# Patient Record
Sex: Male | Born: 1990 | Race: White | Hispanic: No | Marital: Single | Smoking: Never smoker
Health system: Southern US, Community
[De-identification: ages and names within clinical notes are randomized; demographics above are authoritative.]

## PROBLEM LIST (undated history)

## (undated) DIAGNOSIS — F909 Attention-deficit hyperactivity disorder, unspecified type: Secondary | ICD-10-CM

## (undated) DIAGNOSIS — F419 Anxiety disorder, unspecified: Secondary | ICD-10-CM

## (undated) DIAGNOSIS — F32A Depression, unspecified: Secondary | ICD-10-CM

## (undated) DIAGNOSIS — F329 Major depressive disorder, single episode, unspecified: Secondary | ICD-10-CM

---
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
## 2012-05-21 ENCOUNTER — Encounter: Payer: Self-pay | Admitting: Internal Medicine

## 2012-05-21 ENCOUNTER — Other Ambulatory Visit (INDEPENDENT_AMBULATORY_CARE_PROVIDER_SITE_OTHER): Payer: BC Managed Care – PPO

## 2012-05-21 ENCOUNTER — Telehealth: Payer: Self-pay | Admitting: *Deleted

## 2012-05-21 ENCOUNTER — Ambulatory Visit (INDEPENDENT_AMBULATORY_CARE_PROVIDER_SITE_OTHER): Payer: BC Managed Care – PPO | Admitting: Internal Medicine

## 2012-05-21 VITALS — BP 122/88 | HR 76 | Temp 97.4°F | Ht 72.0 in | Wt 179.5 lb

## 2012-05-21 DIAGNOSIS — Z130 Encounter for screening for diseases of the blood and blood-forming organs and certain disorders involving the immune mechanism: Secondary | ICD-10-CM

## 2012-05-21 DIAGNOSIS — Z0000 Encounter for general adult medical examination without abnormal findings: Secondary | ICD-10-CM

## 2012-05-21 DIAGNOSIS — Z131 Encounter for screening for diabetes mellitus: Secondary | ICD-10-CM

## 2012-05-21 DIAGNOSIS — Z13220 Encounter for screening for lipoid disorders: Secondary | ICD-10-CM

## 2012-05-21 LAB — BASIC METABOLIC PANEL
Calcium: 9.3 mg/dL (ref 8.4–10.5)
GFR: 99.72 mL/min (ref >60.00)
Glucose, Bld: 104 mg/dL — ABNORMAL HIGH (ref 70–99)

## 2012-05-21 LAB — LIPID PANEL
Cholesterol: 166 mg/dL (ref 0–200)
HDL: 33.6 mg/dL — ABNORMAL LOW (ref >39.00)
VLDL: 33.2 mg/dL (ref 0.0–40.0)

## 2012-05-21 LAB — CBC
Hemoglobin: 14.8 g/dL (ref 13.0–17.0)
Platelets: 237 10*3/uL (ref 150.0–400.0)
WBC: 10.2 10*3/uL (ref 4.5–10.5)

## 2012-05-21 NOTE — Patient Instructions (Signed)
Health Maintenance, 18- to 21-Year-Old SCHOOL PERFORMANCE After high school completion, the young adult may be attending college, technical or vocational school, or entering the military or the work force. SOCIAL AND EMOTIONAL DEVELOPMENT The young adult establishes adult relationships and explores sexual identity. Young adults may be living at home or in a college dorm or apartment. Increasing independence is important with young adults. Throughout adolescence, teens should assume responsibility of their own health care. IMMUNIZATIONS Most young adults should be fully vaccinated. A booster dose of Tdap (tetanus, diphtheria, and pertussis, or "whooping cough"), a dose of meningococcal vaccine to protect against a certain type of bacterial meningitis, hepatitis A, human papillomarvirus (HPV), chickenpox, or measles vaccines may be indicated, if not given at an earlier age. Annual influenza or "flu" vaccination should be considered during flu season.   TESTING Annual screening for vision and hearing problems is recommended. Vision should be screened objectively at least once between 18 and 21 years of age. The young adult may be screened for anemia or tuberculosis. Young adults should have a blood test to check for high cholesterol during this time period. Young adults should be screened for use of alcohol and drugs. If the young adult is sexually active, screening for sexually transmitted infections, pregnancy, or HIV may be performed. Screening for cervical cancer should be performed within 3 years of beginning sexual activity. NUTRITION AND ORAL HEALTH  Adequate calcium intake is important. Consume 3 servings of low-fat milk and dairy products daily. For those who do not drink milk or consume dairy products, calcium enriched foods, such as juice, bread, or cereal, dark, leafy greens, or canned fish are alternate sources of calcium.   Drink plenty of water. Limit fruit juice to 8 to 12 ounces per day.  Avoid sugary beverages or sodas.   Discourage skipping meals, especially breakfast. Teens should eat a good variety of vegetables and fruits, as well as lean meats.   Avoid high fat, high salt, and high sugar foods, such as candy, chips, and cookies.   Encourage young adults to participate in meal planning and preparation.   Eat meals together as a family whenever possible. Encourage conversation at mealtime.   Limit fast food choices and eating out at restaurants.   Brush teeth twice a day and floss.   Schedule dental exams twice a year.  SLEEP Regular sleep habits are important. PHYSICAL, SOCIAL, AND EMOTIONAL DEVELOPMENT  One hour of regular physical activity daily is recommended. Continue to participate in sports.   Encourage young adults to develop their own interests and consider community service or volunteerism.   Provide guidance to the young adult in making decisions about college and work plans.   Make sure that young adults know that they should never be in a situation that makes them uncomfortable, and they should tell partners if they do not want to engage in sexual activity.   Talk to the young adult about body image. Eating disorders may be noted at this time. Young adults may also be concerned about being overweight. Monitor the young adult for weight gain or loss.   Mood disturbances, depression, anxiety, alcoholism, or attention problems may be noted in young adults. Talk to the caregiver if there are concerns about mental illness.   Negotiate limit setting and independent decision making.   Encourage the young adult to handle conflict without physical violence.   Avoid loud noises which may impair hearing.   Limit television and computer time to 2 hours per   day. Individuals who engage in excessive sedentary activity are more likely to become overweight.  RISK BEHAVIORS  Sexually active young adults need to take precautions against pregnancy and sexually  transmitted infections. Talk to young adults about contraception.   Provide a tobacco-free and drug-free environment for the young adult. Talk to the young adult about drug, tobacco, and alcohol use among friends or at friends' homes. Make sure the young adult knows that smoking tobacco or marijuana and taking drugs have health consequences and may impact brain development.   Teach the young adult about appropriate use of over-the-counter or prescription medicines.   Establish guidelines for driving and for riding with friends.   Talk to young adults about the risks of drinking and driving or boating. Encourage the young adult to call you if he or she or friends have been drinking or using drugs.   Remind young adults to wear seat belts at all times in cars and life vests in boats.   Young adults should always wear a properly fitted helmet when they are riding a bicycle.   Use caution with all-terrain vehicles (ATVs) or other motorized vehicles.   Do not keep handguns in the home. (If you do, the gun and ammunition should be locked separately and out of the young adult's access.)   Equip your home with smoke detectors and change the batteries regularly. Make sure all family members know the fire escape plans for your home.   Teach young adults not to swim alone and not to dive in shallow water.   All individuals should wear sunscreen that protects against UVA and UVB light with at least a sun protection factor (SPF) of 30 when out in the sun. This minimizes sun burning.  WHAT'S NEXT? Young adults should visit their pediatrician or family physician yearly. By young adulthood, health care should be transitioned to a family physician or internal medicine specialist. Sexually active females may want to begin annual physical exams with a gynecologist. Document Released: 08/11/2006 Document Revised: 08/08/2011 Document Reviewed: 08/31/2006 [facility] Patient Information 2013 [place], [place].    Self-Exam Of The Testicles Young men ages 68-35 are the ones who most commonly get cancer of the testicles. About 300 young men die of this disease each year. Testicular cancer does occur in middle-aged or older men but to a lesser extent. This cancer can almost always be cured if it is found before it gets bad and if it is treated. WORDS TO KNOW:  Testicles are the 2 egg-shaped glands that make hormones and sperm in men.   The scrotum is the skin around testicles.   Epididymis is the rope-like part that is behind and above each testis. It collects sperm made by the testis.  WHICH MEN ARE AT HIGH RISK FOR CANCER OF THE TESTICLES?  Men between ages 8 to 61.   Men who are Caucasian.   Men who were born with a testicle that had not moved down into the scrotum.   Men whose testicles have gotten smaller because of an infection.   Men whose fathers or brothers have had cancer of the testicles.  SYMPTOMS OF TESTICULAR CANCER  A painless swelling in one of your testicles.   A hard lump. Some lumps may be an infection.   A heavy feeling in your testicles.   An ache in your lower belly (abdomen) or groin.  HOW OFTEN SHOULD I CHECK MY TESTICLES? You should check your testicles every month. HOW SHOULD I DO THIS  CHECK? It is best to check your testicles right after a warm shower or bath.   Look at your testicles for any swelling. You may need to use a mirror.   Use both your hands to roll each testicle between your thumb and fingers. Feel for any lumps or changes in the size of the testicle. Press firmly. You may find that one testicle is a little bigger than the other. This is normal.   Next, check the epididymis on each testicle. This is the part where most testicular cancers happen. It is normal for the epididymis to feel soft and uneven. When you get used to how your epididymis feels, you will be able to tell if there is any change.   WHAT IF I FIND ANY SWELLINGS OR LUMPS?   Call your  doctor. Some lumps may be an infection. If the lump or swelling is a cancer, it can be treated before it gets worse.   Document Released: 08/12/2008 Document Revised: 08/08/2011 Document Reviewed: 08/12/2008 [facility] Patient Information 2013 [place], [place].

## 2012-05-21 NOTE — Telephone Encounter (Signed)
PATIENT MOTHER CALLED NEEDING TO KNOW WHAT THE NURSE PRACTIONER NEEDED CONCERNING HER SON ADHD. EXPLAINED THAT WOULD NEED DIAGNOSIS OF PROVIDER OR PEDIATRICIAN THAT DIAGNOSED PATIENT WITH ADHD.  MOTHER STATES WILL TRY TO FIND OUT THIS INFORMATION. FAX # GIVEN TO MOM OF OUR OFFICE, HER CB # IS [phone].

## 2012-05-21 NOTE — Progress Notes (Signed)
HPI  Pt presents to the clinic today to establish care. He does have some questions regarding ADHD. He was diagnosed at age 21 and placed on Concerta. He was on that for 6 years before he decided he wanted to stop taking the medicine. He did well throughout middle school and high school. Now that he is in college he is having some recurrent symptoms. He is having trouble concentrating, having the motivation to get homework done. He is wondering wether or not he should go back on the concerta.  History reviewed. No pertinent past medical history.  No current outpatient prescriptions on file.    No Known Allergies  Family History  Problem Relation Age of Onset  . Hypertension Maternal Grandmother   . Cancer Maternal Grandfather     Prostate Cancer  . Hypertension Maternal Grandfather   . Diabetes Paternal Grandmother     History   Social History  . Marital Status: Single    Spouse Name: N/A    Number of Children: N/A  . Years of Education: 12+   Occupational History  . Student    Social History Main Topics  . Smoking status: Never Smoker   . Smokeless tobacco: Never Used  . Alcohol Use: 4.2 oz/week    7 Cans of beer per week  . Drug Use: No  . Sexually Active: Yes    Birth Control/ Protection: Condom   Other Topics Concern  . Not on file   Social History Narrative   Regular exercise-noCaffeine Use-yes    ROS:  Constitutional: Denies fever, malaise, fatigue, headache or abrupt weight changes.  HEENT: Denies eye pain, eye redness, ear pain, ringing in the ears, wax buildup, runny nose, nasal congestion, bloody nose, or sore throat. Respiratory: Denies difficulty breathing, shortness of breath, cough or sputum production.   Cardiovascular: Denies chest pain, chest tightness, palpitations or swelling in the hands or feet.  Gastrointestinal: Denies abdominal pain, bloating, constipation, diarrhea or blood in the stool.  GU: Denies frequency, urgency, pain with urination,  blood in urine, odor or discharge. Musculoskeletal: Denies decrease in range of motion, difficulty with gait, muscle pain or joint pain and swelling.  Skin: Acne on face. Denies redness, rashes, lesions or ulcercations.  Neurological: Denies dizziness, difficulty with memory, difficulty with speech or problems with balance and coordination.   No other specific complaints in a complete review of systems (except as listed in HPI above).  PE:  BP 122/88  Pulse 76  Temp 97.4 F (36.3 C) (Oral)  Ht 6' (1.829 m)  Wt 179 lb 8 oz (81.421 kg)  BMI 24.34 kg/m2  SpO2 97% Wt Readings from Last 3 Encounters:  05/21/12 179 lb 8 oz (81.421 kg)    General: Appears his stated age, well developed, well nourished in NAD. HEENT: Head: normal shape and size; Eyes: sclera white, no icterus, conjunctiva pink, PERRLA and EOMs intact; Ears: Tm's gray and intact, normal light reflex; Nose: mucosa pink and moist, septum midline; Throat/Mouth: Teeth present, mucosa pink and moist, no lesions or ulcerations noted.  Neck: Normal range of motion. Neck supple, trachea midline. No massses, lumps or thyromegaly present.  Cardiovascular: Normal rate and rhythm. S1,S2 noted.  No murmur, rubs or gallops noted. No JVD or BLE edema. No carotid bruits noted. Pulmonary/Chest: Normal effort and positive vesicular breath sounds. No respiratory distress. No wheezes, rales or ronchi noted.  Abdomen: Soft and nontender. Normal bowel sounds, no bruits noted. No distention or masses noted. Liver, spleen and  kidneys non palpable. Musculoskeletal: Normal range of motion. No signs of joint swelling. No difficulty with gait.  Neurological: Alert and oriented. Cranial nerves II-XII intact. Coordination normal. +DTRs bilaterally. Psychiatric: Mood and affect normal. Behavior is normal. Judgment and thought content normal. Denies SI/HI    Assessment and Plan:  Preventative Health Maintenance:  Continue diet and exercise Pt declines  flu shot Tdap up to date Will obtain basic screening labs today  ADHD:  Fax records from previous provider If has a diagnosis of ADHD, will restart concerta  Depression:  Reassured pt May benefit from CBT Pt declines treatment at this time  RTC in 1 year or sooner if needed.

## 2012-06-05 ENCOUNTER — Other Ambulatory Visit: Payer: Self-pay | Admitting: Internal Medicine

## 2012-06-05 ENCOUNTER — Telehealth: Payer: Self-pay | Admitting: *Deleted

## 2012-06-05 DIAGNOSIS — F900 Attention-deficit hyperactivity disorder, predominantly inattentive type: Secondary | ICD-10-CM

## 2012-06-05 MED ORDER — METHYLPHENIDATE HCL ER (OSM) 36 MG PO TBCR: 36.0000 mg | EXTENDED_RELEASE_TABLET | ORAL | Status: DC

## 2012-06-05 NOTE — Telephone Encounter (Signed)
Ash,  RX done. He needs to see me in 1 month to assess medication effectiveness. Rene Kocher

## 2012-06-05 NOTE — Telephone Encounter (Signed)
Pt informed rx ready for pickup and of NP's advisement for F/U OV in 1 month. Rx placed upfront in cabinet ready for pickup.

## 2012-06-05 NOTE — Telephone Encounter (Signed)
Pt would like to restart the Concerta.

## 2012-06-05 NOTE — Telephone Encounter (Signed)
Message copied by Carin Primrose on Tue Jun 05, 2012 11:31 AM ------      Message from: Lorre Munroe      Created: Tue Jun 05, 2012  8:12 AM      Regarding: Medical Records       Ash,      Can you please call Mr. Hentges and let him know that we received his medical records confirming that he did have ADHD and was treated with Concerta. Does he want to move forward with being retested by psychiatry or would he like to restart the Concerta?      Rene Kocher

## 2012-06-08 ENCOUNTER — Telehealth: Payer: Self-pay | Admitting: Internal Medicine

## 2012-06-08 NOTE — Telephone Encounter (Signed)
Patients mother is calling because the need a prior auth for the Concerta that he picked up yesterday, if you have questions call the patients mother

## 2012-06-08 NOTE — Telephone Encounter (Signed)
PA form for Concerta received-placed on NP's desk for completion/review.

## 2012-06-11 NOTE — Telephone Encounter (Signed)
Patients mother is calling back to check the status of the PA for the patients concerta, would like a call back today @ [phone]

## 2012-06-11 NOTE — Telephone Encounter (Signed)
[place], Ashley is out of the office today. Hopefully she will be back tomorrow and can work on it then. Rene Kocher

## 2012-06-12 NOTE — Telephone Encounter (Signed)
PA form faxed to insurance company, awaiting repsonse. Pt's mother informed.

## 2012-06-13 NOTE — Telephone Encounter (Signed)
PA for Concerta approved 06/12/2012-06/12/2013. [facility] informed.

## 2012-06-13 NOTE — Telephone Encounter (Signed)
Pt's mother also informed PA for Concerta improved.

## 2012-07-18 ENCOUNTER — Ambulatory Visit (INDEPENDENT_AMBULATORY_CARE_PROVIDER_SITE_OTHER): Payer: BC Managed Care – PPO | Admitting: Internal Medicine

## 2012-07-18 ENCOUNTER — Encounter: Payer: Self-pay | Admitting: Internal Medicine

## 2012-07-18 VITALS — BP 128/80 | HR 81 | Temp 97.1°F | Ht 72.0 in | Wt 181.0 lb

## 2012-07-18 DIAGNOSIS — F988 Other specified behavioral and emotional disorders with onset usually occurring in childhood and adolescence: Secondary | ICD-10-CM

## 2012-07-18 DIAGNOSIS — F900 Attention-deficit hyperactivity disorder, predominantly inattentive type: Secondary | ICD-10-CM | POA: Insufficient documentation

## 2012-07-18 MED ORDER — METHYLPHENIDATE HCL ER (OSM) 36 MG PO TBCR: 36.0000 mg | EXTENDED_RELEASE_TABLET | ORAL | Status: DC

## 2012-07-18 NOTE — Progress Notes (Signed)
  Subjective:    Patient ID: Malik Cook, male    DOB: 01-27-1991, 22 y.o.   MRN: [id number]  HPI  Pt presents to the clinic today to f/u ADHD. At his last visit, he was restarted on his ADHD medication. At that time, he was interested in seeing a psychiatrist to be evaluated to still see if he had ADHD. He also had his records sent from his pediatrician who managed his ADHD when he was a child. He reports increased focus and attention. He has not had any decrease in his appetite. He feels like he is tolerating the medication well.  Review of Systems  No past medical history on file.  Current Outpatient Prescriptions  Medication Sig Dispense Refill  . methylphenidate (CONCERTA) 36 MG CR tablet Take 1 tablet (36 mg total) by mouth every morning.  30 tablet  0   No current facility-administered medications for this visit.    No Known Allergies  Family History  Problem Relation Age of Onset  . Hypertension Maternal Grandmother   . Cancer Maternal Grandfather     Prostate Cancer  . Hypertension Maternal Grandfather   . Diabetes Paternal Grandmother     History   Social History  . Marital Status: Single    Spouse Name: N/A    Number of Children: N/A  . Years of Education: 12+   Occupational History  . Student    Social History Main Topics  . Smoking status: Never Smoker   . Smokeless tobacco: Never Used  . Alcohol Use: 4.2 oz/week    7 Cans of beer per week  . Drug Use: No  . Sexually Active: Yes    Birth Control/ Protection: Condom   Other Topics Concern  . Not on file   Social History Narrative   Regular exercise-no   Caffeine Use-yes     Constitutional: Denies fever, malaise, fatigue, headache or abrupt weight changes.  Respiratory: Denies difficulty breathing, shortness of breath, cough or sputum production.   Cardiovascular: Denies chest pain, chest tightness, palpitations or swelling in the hands or feet.  Neurological: Denies dizziness, difficulty with  memory, difficulty with speech or problems with balance and coordination.   No other specific complaints in a complete review of systems (except as listed in HPI above).     Objective:   Physical Exam    BP 128/80  Pulse 81  Temp(Src) 97.1 F (36.2 C) (Oral)  Ht 6' (1.829 m)  Wt 181 lb (82.101 kg)  BMI 24.54 kg/m2  SpO2 97% Wt Readings from Last 3 Encounters:  07/18/12 181 lb (82.101 kg)  05/21/12 179 lb 8 oz (81.421 kg)    General: Appears his stated age, well developed, well nourished in NAD. Cardiovascular: Normal rate and rhythm. S1,S2 noted.  No murmur, rubs or gallops noted. No JVD or BLE edema. No carotid bruits noted. Pulmonary/Chest: Normal effort and positive vesicular breath sounds. No respiratory distress. No wheezes, rales or ronchi noted.  Neurological: Alert and oriented. Cranial nerves II-XII intact. Coordination normal. +DTRs bilaterally.      Assessment & Plan:   ADHD,  No additional workup required:  eRx for Concerta 36 mg daily  RTC in 1 month for f/u

## 2012-07-18 NOTE — Patient Instructions (Signed)
Attention Deficit Hyperactivity Disorder Attention deficit hyperactivity disorder (ADHD) is a problem with behavior issues based on the way the brain functions (neurobehavioral disorder). It is a common reason for behavior and academic problems in school. CAUSES  The cause of ADHD is unknown in most cases. It may run in families. It sometimes can be associated with learning disabilities and other behavioral problems. SYMPTOMS  There are 3 types of ADHD. The 3 types and some of the symptoms include:  Inattentive  Gets bored or distracted easily.  Loses or forgets things. Forgets to hand in homework.  Has trouble organizing or completing tasks.  Difficulty staying on task.  An inability to organize daily tasks and school work.  Leaving projects, chores, or homework unfinished.  Trouble paying attention or responding to details. Careless mistakes.  Difficulty following directions. Often seems like is not listening.  Dislikes activities that require sustained attention (like chores or homework).  Hyperactive-impulsive  Feels like it is impossible to sit still or stay in a seat. Fidgeting with hands and feet.  Trouble waiting turn.  Talking too much or out of turn. Interruptive.  Speaks or acts impulsively.  Aggressive, disruptive behavior.  Constantly busy or on the go, noisy.  Combined  Has symptoms of both of the above. Often children with ADHD feel discouraged about themselves and with school. They often perform well below their abilities in school. These symptoms can cause problems in home, school, and in relationships with peers. As children get older, the excess motor activities can calm down, but the problems with paying attention and staying organized persist. Most children do not outgrow ADHD but with good treatment can learn to cope with the symptoms. DIAGNOSIS  When ADHD is suspected, the diagnosis should be made by professionals trained in ADHD.  Diagnosis will  include:  Ruling out other reasons for the child's behavior.  The caregivers will check with the child's school and check their medical records.  They will talk to teachers and parents.  Behavior rating scales for the child will be filled out by those dealing with the child on a daily basis. A diagnosis is made only after all information has been considered. TREATMENT  Treatment usually includes behavioral treatment often along with medicines. It may include stimulant medicines. The stimulant medicines decrease impulsivity and hyperactivity and increase attention. Other medicines used include antidepressants and certain blood pressure medicines. Most experts agree that treatment for ADHD should address all aspects of the child's functioning. Treatment should not be limited to the use of medicines alone. Treatment should include structured classroom management. The parents must receive education to address rewarding good behavior, discipline, and limit-setting. Tutoring or behavioral therapy or both should be available for the child. If untreated, the disorder can have long-term serious effects into adolescence and adulthood. HOME CARE INSTRUCTIONS   Often with ADHD there is a lot of frustration among the family in dealing with the illness. There is often blame and anger that is not warranted. This is a life long illness. There is no way to prevent ADHD. In many cases, because the problem affects the family as a whole, the entire family may need help. A therapist can help the family find better ways to handle the disruptive behaviors and promote change. If the child is young, most of the therapist's work is with the parents. Parents will learn techniques for coping with and improving their child's behavior. Sometimes only the child with the ADHD needs counseling. Your caregivers can help   you make these decisions.  Children with ADHD may need help in organizing. Some helpful tips include:  Keep  routines the same every day from wake-up time to bedtime. Schedule everything. This includes homework and playtime. This should include outdoor and indoor recreation. Keep the schedule on the refrigerator or a bulletin board where it is frequently seen. Mark schedule changes as far in advance as possible.  Have a place for everything and keep everything in its place. This includes clothing, backpacks, and school supplies.  Encourage writing down assignments and bringing home needed books.  Offer your child a well-balanced diet. Breakfast is especially important for school performance. Children should avoid drinks with caffeine including:  Soft drinks.  Coffee.  Tea.  However, some older children (adolescents) may find these drinks helpful in improving their attention.  Children with ADHD need consistent rules that they can understand and follow. If rules are followed, give small rewards. Children with ADHD often receive, and expect, criticism. Look for good behavior and praise it. Set realistic goals. Give clear instructions. Look for activities that can foster success and self-esteem. Make time for pleasant activities with your child. Give lots of affection.  Parents are their children's greatest advocates. Learn as much as possible about ADHD. This helps you become a stronger and better advocate for your child. It also helps you educate your child's teachers and instructors if they feel inadequate in these areas. Parent support groups are often helpful. A national group with local chapters is called CHADD (Children and Adults with Attention Deficit Hyperactivity Disorder). PROGNOSIS  There is no cure for ADHD. Children with the disorder seldom outgrow it. Many find adaptive ways to accommodate the ADHD as they mature. SEEK MEDICAL CARE IF:  Your child has repeated muscle twitches, cough or speech outbursts.  Your child has sleep problems.  Your child has a marked loss of  appetite.  Your child develops depression.  Your child has new or worsening behavioral problems.  Your child develops dizziness.  Your child has a racing heart.  Your child has stomach pains.  Your child develops headaches. Document Released: 05/06/2002 Document Revised: 08/08/2011 Document Reviewed: 12/17/2007 [employer] Patient Information 2013 [employer].  

## 2012-08-19 ENCOUNTER — Encounter (HOSPITAL_COMMUNITY): Payer: Self-pay | Admitting: *Deleted

## 2012-08-19 ENCOUNTER — Emergency Department (HOSPITAL_COMMUNITY)
Admission: EM | Admit: 2012-08-19 | Discharge: 2012-08-19 | Disposition: A | Discharge status: Home / Self Care | Payer: Worker's Compensation | Attending: Emergency Medicine | Admitting: Emergency Medicine

## 2012-08-19 DIAGNOSIS — S0003XA Contusion of scalp, initial encounter: Secondary | ICD-10-CM | POA: Insufficient documentation

## 2012-08-19 DIAGNOSIS — Y9389 Activity, other specified: Secondary | ICD-10-CM | POA: Insufficient documentation

## 2012-08-19 DIAGNOSIS — Y990 Civilian activity done for income or pay: Secondary | ICD-10-CM | POA: Insufficient documentation

## 2012-08-19 DIAGNOSIS — S0093XA Contusion of unspecified part of head, initial encounter: Secondary | ICD-10-CM

## 2012-08-19 DIAGNOSIS — Z79899 Other long term (current) drug therapy: Secondary | ICD-10-CM | POA: Insufficient documentation

## 2012-08-19 DIAGNOSIS — Y9289 Other specified places as the place of occurrence of the external cause: Secondary | ICD-10-CM | POA: Insufficient documentation

## 2012-08-19 DIAGNOSIS — S1093XA Contusion of unspecified part of neck, initial encounter: Secondary | ICD-10-CM | POA: Insufficient documentation

## 2012-08-19 DIAGNOSIS — W2209XA Striking against other stationary object, initial encounter: Secondary | ICD-10-CM | POA: Insufficient documentation

## 2012-08-19 DIAGNOSIS — R5381 Other malaise: Secondary | ICD-10-CM | POA: Insufficient documentation
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
## 2012-08-19 NOTE — ED Provider Notes (Signed)
History    This chart was scribed for non-physician practitioner working with Richardean Canal, MD by Donne Anon, ED Scribe. This patient was seen in room WTR8/WTR8 and the patient's care was started at 2222.   CSN: [id number]  Arrival date & time 08/19/12  2204   First MD Initiated Contact with Patient 08/19/12 2222      Chief Complaint  Patient presents with  . Head Injury     The history is provided by the patient. No language interpreter was used.   Malik Cook is a 22 y.o. male who presents to the Emergency Department complaining of sudden onset, constant, non changing, non radiating head pain due to being hit in the head with a steel oven door at work 7 hours PTA. He states he does not remember being hit but denies LOC. He states he has been having an intermittent shooting HA. He states it feels hard to communicate and feels like he has to think more. He reports associated tiredness, dizziness when standing for long periods of time and difficulty walking. He denies vision changes, vomitting or any other pain.  History reviewed. No pertinent past medical history.  Past Surgical History  Procedure Laterality Date  . Mole removal  1997    Family History  Problem Relation Age of Onset  . Hypertension Maternal Grandmother   . Cancer Maternal Grandfather     Prostate Cancer  . Hypertension Maternal Grandfather   . Diabetes Paternal Grandmother     History  Substance Use Topics  . Smoking status: Never Smoker   . Smokeless tobacco: Never Used  . Alcohol Use: 4.2 oz/week    7 Cans of beer per week      Review of Systems  Neurological: Positive for headaches.  All other systems reviewed and are negative.    Allergies  Review of patient's allergies indicates no known allergies.  Home Medications   Current Outpatient Rx  Name  Route  Sig  Dispense  Refill  . methylphenidate (CONCERTA) 36 MG CR tablet   Oral   Take 1 tablet (36 mg total) by mouth every morning.    30 tablet   0     BP 143/91  Pulse 69  Temp(Src) 98 F (36.7 C) (Oral)  Resp 18  SpO2 100%  Physical Exam  Nursing note and vitals reviewed. Constitutional: He is oriented to person, place, and time. He appears well-developed and well-nourished. No distress.  HENT:  Head: Normocephalic and atraumatic.  Eyes: EOM are normal.  Neck: Neck supple. No tracheal deviation present.  Cardiovascular: Normal rate.   Pulmonary/Chest: Effort normal. No respiratory distress.  Musculoskeletal: Normal range of motion.  Neurological: He is alert and oriented to person, place, and time. He has normal strength. No cranial nerve deficit or sensory deficit. He displays a negative Romberg sign. GCS eye subscore is 4. GCS verbal subscore is 5. GCS motor subscore is 6.  Skin: Skin is warm and dry.  Psychiatric: He has a normal mood and affect. His behavior is normal.    ED Course  Procedures (including critical care time) DIAGNOSTIC STUDIES: Oxygen Saturation is 99% on room air, normal by my interpretation.    COORDINATION OF CARE: 10:38 PM Discussed treatment plan which includes Advil and Tylenol with pt at bedside and pt agreed to plan. Advised pt not to drive for 2 days. Advised pt to return if he begins vomiting, confused, blurred vision, or severe, non-shooting HA.     Labs  Reviewed - No data to display No results found.   1. Head contusion, initial encounter       MDM   Pt des not have any risk factors suggesting the need for head CT according to the [place] CT head criteria   Pt has been advised of the symptoms that warrant their return to the ED. Patient has voiced understanding and has agreed to follow-up with the PCP or specialist.  I personally performed the services described in this documentation, which was scribed in my presence. The recorded information has been reviewed and is accurate.         Dorthula Matas, PA-C 08/20/12 0000

## 2012-08-22 NOTE — ED Provider Notes (Signed)
Medical screening examination/treatment/procedure(s) were performed by non-physician practitioner and as supervising physician I was immediately available for consultation/collaboration.   Richardean Canal, MD 08/22/12 [phone]

## 2012-09-18 ENCOUNTER — Telehealth: Payer: Self-pay | Admitting: Internal Medicine

## 2012-09-18 DIAGNOSIS — F900 Attention-deficit hyperactivity disorder, predominantly inattentive type: Secondary | ICD-10-CM

## 2012-09-18 MED ORDER — METHYLPHENIDATE HCL ER (OSM) 36 MG PO TBCR: 36.0000 mg | EXTENDED_RELEASE_TABLET | ORAL | Status: DC

## 2012-09-18 NOTE — Telephone Encounter (Signed)
Pt needs to pick up an RX for Concerta.   Please call when ready.

## 2012-09-18 NOTE — Telephone Encounter (Signed)
Pt informed rx ready for pickup, placed upfront in cabinet.  

## 2012-09-18 NOTE — Telephone Encounter (Signed)
Ash, Can you call Mr. Malik Cook and let him know that his RX is ready to be picked up. Rene Kocher

## 2013-01-31 ENCOUNTER — Telehealth: Payer: Self-pay | Admitting: *Deleted

## 2013-01-31 DIAGNOSIS — F900 Attention-deficit hyperactivity disorder, predominantly inattentive type: Secondary | ICD-10-CM

## 2013-01-31 MED ORDER — METHYLPHENIDATE HCL ER (OSM) 36 MG PO TBCR: 36.0000 mg | EXTENDED_RELEASE_TABLET | ORAL | Status: DC

## 2013-01-31 NOTE — Telephone Encounter (Signed)
Pt called requesting a Concerta refill.  Pts last OV 2.19.2014.  Please advise

## 2013-01-31 NOTE — Telephone Encounter (Signed)
Rx printed signed by Dr Jonny Ruiz in Regina's absence.  Pt notified Rx ready for pickup.

## 2013-01-31 NOTE — Telephone Encounter (Signed)
Ok to refill 

## 2013-03-26 ENCOUNTER — Telehealth: Payer: Self-pay

## 2013-03-26 DIAGNOSIS — F900 Attention-deficit hyperactivity disorder, predominantly inattentive type: Secondary | ICD-10-CM

## 2013-03-26 MED ORDER — METHYLPHENIDATE HCL ER (OSM) 36 MG PO TBCR: 36.0000 mg | EXTENDED_RELEASE_TABLET | ORAL | Status: DC

## 2013-03-26 NOTE — Telephone Encounter (Signed)
Patient called requesting refill on concerta. Please advise. Last OV 07/18/12

## 2013-03-26 NOTE — Telephone Encounter (Signed)
Ok to refill 

## 2013-05-27 ENCOUNTER — Telehealth: Payer: Self-pay | Admitting: Internal Medicine

## 2013-05-27 ENCOUNTER — Other Ambulatory Visit: Payer: Self-pay

## 2013-05-27 DIAGNOSIS — F900 Attention-deficit hyperactivity disorder, predominantly inattentive type: Secondary | ICD-10-CM

## 2013-05-27 MED ORDER — METHYLPHENIDATE HCL ER (OSM) 36 MG PO TBCR: 36.0000 mg | EXTENDED_RELEASE_TABLET | ORAL | Status: DC

## 2013-05-27 MED ORDER — METHYLPHENIDATE HCL ER (OSM) 36 MG PO TBCR: 36.0000 mg | EXTENDED_RELEASE_TABLET | ORAL | Status: AC

## 2013-05-27 NOTE — Telephone Encounter (Signed)
Ok to refill 30d (per protocol covering for absent PCP) - prescription printed and signed -

## 2013-05-27 NOTE — Telephone Encounter (Signed)
Rx left in front office for pick up and pt is aware  

## 2013-05-27 NOTE — Telephone Encounter (Signed)
Pt left /vm requesting rx concerta. Call when ready for pick up. 

## 2013-05-27 NOTE — Telephone Encounter (Signed)
05/27/2013  Pt is requesting a refill on rx : methylphenidate (CONCERTA) 36 MG CR tablet.

## 2013-08-20 ENCOUNTER — Observation Stay (HOSPITAL_COMMUNITY)
Admission: EM | Admit: 2013-08-20 | Discharge: 2013-08-22 | Disposition: A | Discharge status: Home / Self Care | Payer: 59 | Attending: General Surgery | Admitting: General Surgery

## 2013-08-20 ENCOUNTER — Encounter (HOSPITAL_COMMUNITY): Payer: Self-pay | Admitting: Emergency Medicine

## 2013-08-20 ENCOUNTER — Emergency Department (HOSPITAL_COMMUNITY): Payer: 59

## 2013-08-20 DIAGNOSIS — R109 Unspecified abdominal pain: Secondary | ICD-10-CM

## 2013-08-20 DIAGNOSIS — R1031 Right lower quadrant pain: Secondary | ICD-10-CM | POA: Insufficient documentation

## 2013-08-20 DIAGNOSIS — Z79899 Other long term (current) drug therapy: Secondary | ICD-10-CM | POA: Insufficient documentation

## 2013-08-20 DIAGNOSIS — R51 Headache: Secondary | ICD-10-CM | POA: Insufficient documentation

## 2013-08-20 DIAGNOSIS — R197 Diarrhea, unspecified: Secondary | ICD-10-CM

## 2013-08-20 DIAGNOSIS — F3289 Other specified depressive episodes: Secondary | ICD-10-CM | POA: Insufficient documentation

## 2013-08-20 DIAGNOSIS — IMO0001 Reserved for inherently not codable concepts without codable children: Secondary | ICD-10-CM | POA: Insufficient documentation

## 2013-08-20 DIAGNOSIS — F329 Major depressive disorder, single episode, unspecified: Secondary | ICD-10-CM | POA: Insufficient documentation

## 2013-08-20 DIAGNOSIS — F909 Attention-deficit hyperactivity disorder, unspecified type: Secondary | ICD-10-CM | POA: Insufficient documentation

## 2013-08-20 DIAGNOSIS — F411 Generalized anxiety disorder: Secondary | ICD-10-CM | POA: Insufficient documentation

## 2013-08-20 HISTORY — DX: Major depressive disorder, single episode, unspecified: F32.9

## 2013-08-20 HISTORY — DX: Attention-deficit hyperactivity disorder, unspecified type: F90.9

## 2013-08-20 HISTORY — DX: Depression, unspecified: F32.A

## 2013-08-20 HISTORY — DX: Anxiety disorder, unspecified: F41.9

## 2013-08-20 LAB — I-STAT CHEM 8, ED
BUN: 16 mg/dL (ref 6–23)
CALCIUM ION: 1.07 mmol/L — AB (ref 1.12–1.23)
Chloride: 103 mEq/L (ref 96–112)
Creatinine, Ser: 1.3 mg/dL (ref 0.50–1.35)
Glucose, Bld: 87 mg/dL (ref 70–99)
HCT: 50 % (ref 39.0–52.0)
HEMOGLOBIN: 17 g/dL (ref 13.0–17.0)
Potassium: 3.9 mEq/L (ref 3.7–5.3)
Sodium: 139 mEq/L (ref 137–147)
TCO2: 20 mmol/L (ref 0–100)

## 2013-08-20 LAB — CREATININE, SERUM
Creatinine, Ser: 1.11 mg/dL (ref 0.50–1.35)
GFR calc non Af Amer: >90 mL/min (ref >90)

## 2013-08-20 MED ORDER — ACETAMINOPHEN 325 MG PO TABS
650.0000 mg | ORAL_TABLET | Freq: Four times a day (QID) | ORAL | Status: DC | PRN
Start: 2013-08-20 — End: 2013-08-22

## 2013-08-20 MED ORDER — INFLUENZA VAC SPLIT QUAD 0.5 ML IM SUSP
0.5000 mL | INTRAMUSCULAR | Status: AC
  Administered 2013-08-21: 0.5 mL via INTRAMUSCULAR
  Filled 2013-08-20 (×2): qty 0.5

## 2013-08-20 MED ORDER — IOHEXOL 300 MG/ML  SOLN
100.0000 mL | Freq: Once | INTRAMUSCULAR | Status: AC | PRN
  Administered 2013-08-20: 100 mL via INTRAVENOUS

## 2013-08-20 MED ORDER — SODIUM CHLORIDE 0.9 % IV BOLUS (SEPSIS)
1000.0000 mL | Freq: Once | INTRAVENOUS | Status: AC
  Administered 2013-08-20: 1000 mL via INTRAVENOUS

## 2013-08-20 MED ORDER — HEPARIN SODIUM (PORCINE) 5000 UNIT/ML IJ SOLN
5000.0000 [IU] | Freq: Three times a day (TID) | INTRAMUSCULAR | Status: DC
  Administered 2013-08-21: 5000 [IU] via SUBCUTANEOUS
  Filled 2013-08-20 (×8): qty 1

## 2013-08-20 MED ORDER — ONDANSETRON HCL 4 MG/2ML IJ SOLN
4.0000 mg | Freq: Four times a day (QID) | INTRAMUSCULAR | Status: DC | PRN
  Administered 2013-08-22: 4 mg via INTRAVENOUS
  Filled 2013-08-20: qty 2

## 2013-08-20 MED ORDER — HYDROCODONE-ACETAMINOPHEN 5-325 MG PO TABS
1.0000 | ORAL_TABLET | ORAL | Status: DC | PRN
  Administered 2013-08-21: 2 via ORAL
  Filled 2013-08-20: qty 2

## 2013-08-20 MED ORDER — MORPHINE SULFATE 4 MG/ML IJ SOLN
4.0000 mg | Freq: Once | INTRAMUSCULAR | Status: AC
  Administered 2013-08-20: 4 mg via INTRAVENOUS
  Filled 2013-08-20: qty 1

## 2013-08-20 MED ORDER — ACETAMINOPHEN 650 MG RE SUPP
650.0000 mg | Freq: Four times a day (QID) | RECTAL | Status: DC | PRN
Start: 2013-08-20 — End: 2013-08-22

## 2013-08-20 MED ORDER — ONDANSETRON HCL 4 MG/2ML IJ SOLN
4.0000 mg | Freq: Once | INTRAMUSCULAR | Status: AC
  Administered 2013-08-20: 4 mg via INTRAVENOUS
  Filled 2013-08-20: qty 2

## 2013-08-20 MED ORDER — POTASSIUM CHLORIDE IN NACL 20-0.9 MEQ/L-% IV SOLN
INTRAVENOUS | Status: DC
  Administered 2013-08-20 – 2013-08-22 (×6): via INTRAVENOUS
  Filled 2013-08-20 (×8): qty 1000

## 2013-08-20 MED ORDER — MORPHINE SULFATE 2 MG/ML IJ SOLN
2.0000 mg | INTRAMUSCULAR | Status: DC | PRN
Start: 2013-08-20 — End: 2013-08-22
  Administered 2013-08-20: 2 mg via INTRAVENOUS
  Filled 2013-08-20: qty 1

## 2013-08-20 MED ORDER — IOHEXOL 300 MG/ML  SOLN
50.0000 mL | Freq: Once | INTRAMUSCULAR | Status: AC | PRN
  Administered 2013-08-20: 50 mL via ORAL

## 2013-08-20 NOTE — Consult Note (Deleted)
Reason for Consult:  Abdominal pain and diarrhea Referring Physician: Dr. Benjiman Core (ED)  Malik Cook is an 23 y.o. male.  HPI: healthy 23 y/o who was fine till yesterday.  He started having mid abdominal pain around noon, and then developed ongoing diarrhea.  He estimates 5-10 times since yesterday.  He was not able to eat supper it made the pain and diarrhea worse. He was able to sleep thru most of the night.  He has had no nausea or vomiting with this.  He still was having pain and some diarrhea this AM.  The diarrhea is better.  [facility] to student [facility] and WBC was 10.1on their CBC.  They sent him to the ER here at [facility].  Work up here includes H/H = 17/50.  Creatinine is up a little at 1.3.   He still complains of some lower abdominal pain, but it does not really localize.  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  He does not know anyone who is sick, and no one in the house he is currently in is sick to his knowledge.  No history of inflammatory bowel disease in the past. CT scan shows a retrocecal appendix with a normal 6mm caliber.  Some enhancement in the mid to distal appendix.  No surrounding inflammatory changes. We are ask to see.  Past Medical History  Diagnosis Date   . ADHD (attention deficit hyperactivity disorder) heis on medication for this      Hx of anxiety/depression, he is seeing someone for this also             Family History  Problem Relation Age of Onset  . Hypertension Maternal Grandmother   . Cancer Maternal Grandfather     Prostate Cancer  . Hypertension Maternal Grandfather   . Diabetes Paternal Grandmother     Social History:  reports that he has never smoked. He has never used smokeless tobacco. He reports that he drinks about 4.2 ounces of alcohol per week. He reports that he does not use illicit drugs. ETOH:  Had about 3 beers over the weekend Tobacco:  He has smoked but not for some time. Drugs:   None  Allergies: No Known Allergies  Medications:  Prior to Admission:  Prior to Admission medications   Medication Sig Start Date End Date Taking? Authorizing Provider  methylphenidate (CONCERTA) 36 MG CR tablet Take 1 tablet (36 mg total) by mouth every morning. 05/27/13 He has been out about 1 month  Yes Newt Lukes, MD    Scheduled:  Continuous: . sodium chloride 1,000 mL (08/20/13 1536)   PRN: Anti-infectives   None      Results for orders placed during the hospital encounter of 08/20/13 (from the past 48 hour(s))  I-STAT CHEM 8, ED     Status: Abnormal   Collection Time    08/20/13  1:29 PM      Result Value Ref Range   Sodium 139  137 - 147 mEq/L   Potassium 3.9  3.7 - 5.3 mEq/L   Chloride 103  96 - 112 mEq/L   BUN 16  6 - 23 mg/dL   Creatinine, Ser 1.61  0.50 - 1.35 mg/dL   Glucose, Bld 87  70 - 99 mg/dL   Calcium, Ion 0.96 (*) 1.12 - 1.23 mmol/L   TCO2 20  0 - 100 mmol/L   Hemoglobin 17.0  13.0 - 17.0 g/dL  HCT 50.0  39.0 - 52.0 %    Ct Abdomen Pelvis W Contrast  08/20/2013   CLINICAL DATA:  Right lower quadrant abdominal pain.  EXAM: CT ABDOMEN AND PELVIS WITH CONTRAST  TECHNIQUE: Multidetector CT imaging of the abdomen and pelvis was performed using the standard protocol following bolus administration of intravenous contrast.  CONTRAST:  100mL OMNIPAQUE IOHEXOL 300 MG/ML  SOLN  COMPARISON:  None.  FINDINGS: Lung bases are clear. No effusions. Heart is normal size.  Liver, gallbladder, spleen, pancreas, adrenals and kidneys are normal.  There is a retrocecal appendix which is normal caliber at 6 mm. There appears to be slight mucosal enhancement within the mid to distal appendix. No surrounding inflammatory change. There are small scattered mesenteric lymph nodes in the right lower quadrant and central mesentery. No adenopathy. No free fluid or free air. Stomach, large and small bowel are unremarkable. Aorta is normal caliber. No acute bony abnormality.   IMPRESSION: Apparent slight mucosal enhancement within the mid to distal appendix, but the appendix is normal caliber and there are no surrounding inflammatory change to suggest appendicitis at this time.   Electronically Signed   By: Charlett NoseKevin  Dover M.D.   On: 08/20/2013 14:38    Review of Systems  Constitutional: Positive for chills. Negative for fever, weight loss, malaise/fatigue and diaphoresis.  HENT: Negative for congestion, ear discharge, ear pain, hearing loss, nosebleeds, sore throat and tinnitus.   Eyes: Negative.   Respiratory: Negative.  Negative for stridor.   Cardiovascular: Negative.   Gastrointestinal: Positive for heartburn (occasional) and diarrhea (5-10 times yesterday and today, diarrhea is better now.). Negative for nausea, vomiting, constipation, blood in stool and melena. Abdominal pain: mid abdomen and lower abdomen below umbilicus.  Genitourinary:       Decreased urine output today.  Musculoskeletal: Positive for myalgias (complains of body aching, especially knees and legs). Negative for back pain, falls, joint pain and neck pain.  Skin: Negative.   Neurological: Positive for headaches. Negative for weakness.       Says he felt a little presyncopal while taking shower earlier this AM  Endo/Heme/Allergies: Negative.   Psychiatric/Behavioral: Positive for depression. The patient is nervous/anxious.        He is seeing someone about this.   Blood pressure 145/84, pulse 109, temperature 99.5 F (37.5 C), temperature source Oral, resp. rate 18, SpO2 97.00%. Physical Exam  Constitutional: He is oriented to person, place, and time. He appears well-developed and well-nourished. No distress.  He was singing with friends when I came in. He rates his pain at a 5/10.  Friends say it was a 9 earlier.  HENT:  Head: Normocephalic and atraumatic.  Nose: Nose normal.  Eyes: Conjunctivae and EOM are normal. Pupils are equal, round, and reactive to light. Right eye exhibits no  discharge. Left eye exhibits no discharge. No scleral icterus.  Neck: Normal range of motion. Neck supple. No JVD present. No tracheal deviation present. No thyromegaly present.  Cardiovascular: Normal rate, regular rhythm, normal heart sounds and intact distal pulses.  Exam reveals no gallop.   No murmur heard. Respiratory: Effort normal and breath sounds normal. No respiratory distress. He has no wheezes. He has no rales. He exhibits no tenderness.  GI: Soft. He exhibits no distension and no mass. There is tenderness (he's a little tender lower abdomen, but I cannot really tell one side is worse than the other.). There is no rebound and no guarding.  BS present,   Musculoskeletal: He  exhibits no edema.  Lymphadenopathy:    He has no cervical adenopathy.  Neurological: He is alert and oriented to person, place, and time. No cranial nerve deficit.  Skin: Skin is warm and dry. No rash noted. He is not diaphoretic. No erythema. No pallor.  Psychiatric: He has a normal mood and affect. His behavior is normal. Judgment and thought content normal.    Assessment/Plan: 1.  Abdominal pain, diarrhea, headache and myalgia. 2.  ADHD  Plan:  His abdomen is still a bit tender, he is a bit dehydrated from diarrhea.  He does not have appendicitis by CT scan.  i will admit him as an observation and see how he does overnight.  If this is an enteritis it should clear.  If it is his appendix he should declare by the Am.    Mandy Peeks 08/20/2013, 4:14 PM

## 2013-08-20 NOTE — ED Notes (Signed)
Pt c/o generalized abdominal pain and diarrhea x 1 day.  Pain score 9/10.  [facility].

## 2013-08-20 NOTE — H&P (Signed)
Reason for Consult:  Abdominal pain and diarrhea Referring Physician: Dr. Benjiman Core (ED)  Malik Cook is an 23 y.o. male.   HPI: healthy 23 y/o who was fine till yesterday.  He started having mid abdominal pain around noon, and then developed ongoing diarrhea.  He estimates 5-10 times since yesterday.  He was not able to eat supper it made the pain and diarrhea worse. He was able to sleep thru most of the night.  He has had no nausea or vomiting with this.  He still was having pain and some diarrhea this AM.  The diarrhea is better.  He went to student [facility] and WBC was 10.1on their CBC.  They sent him to the ER here at [facility].  Work up here includes H/H = 17/50.  Creatinine is up a little at 1.3.    He still complains of some lower abdominal pain, but it does not really localize.  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  He does not know anyone who is sick, and no one in the house he is currently in is sick to his knowledge.  No history of inflammatory bowel disease in the past. CT scan shows a retrocecal appendix with a normal 6mm caliber.  Some enhancement in the mid to distal appendix.  No surrounding inflammatory changes. We are ask to see.    Past Medical History   Diagnosis  Date     .  ADHD (attention deficit hyperactivity disorder) heis on medication for this          Hx of anxiety/depression, he is seeing someone for this also                    Family History   Problem  Relation  Age of Onset   .  Hypertension  Maternal Grandmother     .  Cancer  Maternal Grandfather         Prostate Cancer   .  Hypertension  Maternal Grandfather     .  Diabetes  Paternal Grandmother       Social History: reports that he has never smoked. He has never used smokeless tobacco. He reports that he drinks about 4.2 ounces of alcohol per week. He reports that he does not use illicit drugs. ETOH:  Had about 3 beers over the weekend Tobacco:  He has smoked  but not for some time. Drugs:  None  Allergies: No Known Allergies  Medications:   Prior to Admission:   Prior to Admission medications    Medication  Sig  Start Date  End Date  Taking?  Authorizing Provider   methylphenidate (CONCERTA) 36 MG CR tablet  Take 1 tablet (36 mg total) by mouth every morning.  05/27/13  He has been out about 1 month    Yes  Newt Lukes, MD     Scheduled:  Continuous: .  sodium chloride  1,000 mL (08/20/13 1536)    PRN: Anti-infectives     None          Results for orders placed during the hospital encounter of 08/20/13 (from the past 48 hour(s))   I-STAT CHEM 8, ED     Status: Abnormal     Collection Time      08/20/13  1:29 PM       Result  Value  Ref Range     Sodium  139   137 -  147 mEq/L     Potassium  3.9   3.7 - 5.3 mEq/L     Chloride  103   96 - 112 mEq/L     BUN  16   6 - 23 mg/dL     Creatinine, Ser  6.57   0.50 - 1.35 mg/dL     Glucose, Bld  87   70 - 99 mg/dL     Calcium, Ion  8.46 (*)  1.12 - 1.23 mmol/L     TCO2  20   0 - 100 mmol/L     Hemoglobin  17.0   13.0 - 17.0 g/dL     HCT  96.2   95.2 - 52.0 %     Ct Abdomen Pelvis W Contrast  08/20/2013   CLINICAL DATA:  Right lower quadrant abdominal pain.  EXAM: CT ABDOMEN AND PELVIS WITH CONTRAST  TECHNIQUE: Multidetector CT imaging of the abdomen and pelvis was performed using the standard protocol following bolus administration of intravenous contrast.  CONTRAST:  OMNIPAQUE IOHEXOL 300 MG/ML  SOLN  COMPARISON:  None.  FINDINGS: Lung bases are clear. No effusions. Heart is normal size.  Liver, gallbladder, spleen, pancreas, adrenals and kidneys are normal.  There is a retrocecal appendix which is normal caliber at 6 mm. There appears to be slight mucosal enhancement within the mid to distal appendix. No surrounding inflammatory change. There are small scattered mesenteric lymph nodes in the right lower quadrant and central mesentery. No adenopathy. No free fluid or free air.  Stomach, large and small bowel are unremarkable. Aorta is normal caliber. No acute bony abnormality.  IMPRESSION: Apparent slight mucosal enhancement within the mid to distal appendix, but the appendix is normal caliber and there are no surrounding inflammatory change to suggest appendicitis at this time.   Electronically Signed   By: Charlett Nose M.D.   On: 08/20/2013 14:38     Review of Systems  Constitutional: Positive for chills. Negative for fever, weight loss, malaise/fatigue and diaphoresis.  HENT: Negative for congestion, ear discharge, ear pain, hearing loss, nosebleeds, sore throat and tinnitus.   Eyes: Negative.   Respiratory: Negative.  Negative for stridor.   Cardiovascular: Negative.   Gastrointestinal: Positive for heartburn (occasional) and diarrhea (5-10 times yesterday and today, diarrhea is better now.). Negative for nausea, vomiting, constipation, blood in stool and melena. Abdominal pain: mid abdomen and lower abdomen below umbilicus.  Genitourinary:        Decreased urine output today.  Musculoskeletal: Positive for myalgias (complains of body aching, especially knees and legs). Negative for back pain, falls, joint pain and neck pain.  Skin: Negative.   Neurological: Positive for headaches. Negative for weakness.        Says he felt a little presyncopal while taking shower earlier this AM  Endo/Heme/Allergies: Negative.   Psychiatric/Behavioral: Positive for depression. The patient is nervous/anxious.        He is seeing someone about this.   Blood pressure 145/84, pulse 109, temperature 99.5 F (37.5 C), temperature source Oral, resp. rate 18, SpO2 97.00%. Physical Exam  Constitutional: He is oriented to person, place, and time. He appears well-developed and well-nourished. No distress.  He was singing with friends when I came in. He rates his pain at a 5/10.  Friends say it was a 9 earlier.  HENT:   Head: Normocephalic and atraumatic.   Nose: Nose normal.  Eyes:  Conjunctivae and EOM are normal. Pupils are equal, round, and reactive to light.  Right eye exhibits no discharge. Left eye exhibits no discharge. No scleral icterus.  Neck: Normal range of motion. Neck supple. No JVD present. No tracheal deviation present. No thyromegaly present.  Cardiovascular: Normal rate, regular rhythm, normal heart sounds and intact distal pulses.  Exam reveals no gallop.    No murmur heard. Respiratory: Effort normal and breath sounds normal. No respiratory distress. He has no wheezes. He has no rales. He exhibits no tenderness.  GI: Soft. He exhibits no distension and no mass. There is tenderness (he's a little tender lower abdomen, but I cannot really tell one side is worse than the other.). There is no rebound and no guarding.  BS present,   Musculoskeletal: He exhibits no edema.  Lymphadenopathy:    He has no cervical adenopathy.  Neurological: He is alert and oriented to person, place, and time. No cranial nerve deficit.  Skin: Skin is warm and dry. No rash noted. He is not diaphoretic. No erythema. No pallor.  Psychiatric: He has a normal mood and affect. His behavior is normal. Judgment and thought content normal.    Assessment/Plan: 1.  Abdominal pain, diarrhea, headache and myalgia. 2.  ADHD  Plan:  His abdomen is still a bit tender, he is a bit dehydrated from diarrhea.  He does not have appendicitis by CT scan.  i will admit him as an observation and see how he does overnight.  If this is an enteritis it should clear.  If it is his appendix he should declare by the Am.     Euell Schiff 08/20/2013, 4:14 PM         Revision History...     Date/Time User Action   08/20/2013 4:50 PM Sherrie GeorgeWillard Yusef Lamp, PA-C Sign   08/20/2013 4:49 PM Sherrie GeorgeWillard Marielena Harvell, PA-C Incomplete Revision   08/20/2013 4:24 PM Sherrie GeorgeWillard Axton Cihlar, PA-C Sign  View Details Report

## 2013-08-20 NOTE — ED Provider Notes (Signed)
CSN: [id number]     Arrival date & time 08/20/13  1227 History   First MD Initiated Contact with Patient 08/20/13 1304     Chief Complaint  Patient presents with  . Abdominal Pain  . Diarrhea     (Consider location/radiation/quality/duration/timing/severity/associated sxs/prior Treatment) Patient is a 23 y.o. male presenting with abdominal pain and diarrhea. The history is provided by the patient.  Abdominal Pain Associated symptoms: chills, diarrhea, fever and nausea   Associated symptoms: no chest pain, no dysuria, no shortness of breath and no vomiting   Diarrhea Associated symptoms: abdominal pain, chills and fever   Associated symptoms: no headaches and no vomiting    patient has had lower abdominal pain and diarrhea. His been before today. He got seen at the [facility] and had a white count of 10 with a left shift. He was sent in to rule out appendicitis. He states he's had some fevers now. He has had episodes of diarrhea, but states it is decreased now. He states he has a fever now. He has had nausea but no vomiting.  History reviewed. No pertinent past medical history. Past Surgical History  Procedure Laterality Date  . Mole removal  1997   Family History  Problem Relation Age of Onset  . Hypertension Maternal Grandmother   . Cancer Maternal Grandfather     Prostate Cancer  . Hypertension Maternal Grandfather   . Diabetes Paternal Grandmother    History  Substance Use Topics  . Smoking status: Never Smoker   . Smokeless tobacco: Never Used  . Alcohol Use: 4.2 oz/week    7 Cans of beer per week    Review of Systems  Constitutional: Positive for fever, chills and appetite change. Negative for activity change.  Eyes: Negative for pain.  Respiratory: Negative for chest tightness and shortness of breath.   Cardiovascular: Negative for chest pain and leg swelling.  Gastrointestinal: Positive for nausea, abdominal pain and diarrhea. Negative for vomiting.   Genitourinary: Negative for dysuria and flank pain.  Musculoskeletal: Negative for back pain and neck stiffness.  Skin: Negative for rash.  Neurological: Negative for weakness, numbness and headaches.  Psychiatric/Behavioral: Negative for behavioral problems.      Allergies  Review of patient's allergies indicates no known allergies.  Home Medications   Current Outpatient Rx  Name  Route  Sig  Dispense  Refill  . methylphenidate (CONCERTA) 36 MG CR tablet   Oral   Take 1 tablet (36 mg total) by mouth every morning.   30 tablet   0    BP 145/84  Pulse 109  Temp(Src) 99.5 F (37.5 C) (Oral)  Resp 18  SpO2 97% Physical Exam  Constitutional: He is oriented to person, place, and time. He appears well-developed and well-nourished.  Cardiovascular:  Tachycardia  Pulmonary/Chest: Effort normal and breath sounds normal.  Abdominal: There is tenderness.  Moderate lower abdominal tenderness. No hernias. Mild guarding  Neurological: He is alert and oriented to person, place, and time.  Skin: Skin is warm.    ED Course  Procedures (including critical care time) Labs Review Labs Reviewed  I-STAT CHEM 8, ED - Abnormal; Notable for the following:    Calcium, Ion 1.07 (*)    All other components within normal limits   Imaging Review Ct Abdomen Pelvis W Contrast  08/20/2013   CLINICAL DATA:  Right lower quadrant abdominal pain.  EXAM: CT ABDOMEN AND PELVIS WITH CONTRAST  TECHNIQUE: Multidetector CT imaging of the abdomen  and pelvis was performed using the standard protocol following bolus administration of intravenous contrast.  CONTRAST:  100mL OMNIPAQUE IOHEXOL 300 MG/ML  SOLN  COMPARISON:  None.  FINDINGS: Lung bases are clear. No effusions. Heart is normal size.  Liver, gallbladder, spleen, pancreas, adrenals and kidneys are normal.  There is a retrocecal appendix which is normal caliber at 6 mm. There appears to be slight mucosal enhancement within the mid to distal appendix.  No surrounding inflammatory change. There are small scattered mesenteric lymph nodes in the right lower quadrant and central mesentery. No adenopathy. No free fluid or free air. Stomach, large and small bowel are unremarkable. Aorta is normal caliber. No acute bony abnormality.  IMPRESSION: Apparent slight mucosal enhancement within the mid to distal appendix, but the appendix is normal caliber and there are no surrounding inflammatory change to suggest appendicitis at this time.   Electronically Signed   By: Charlett NoseKevin  Dover M.D.   On: 08/20/2013 14:38     EKG Interpretation None      MDM   Final diagnoses:  None    Patient with abdominal pain and diarrhea. Moderate tenderness. White count is 10.1, however there was a left shift on blood work done [facility] scan does not show a clear appendicitis, but does have some mucosal thickening in the mid to distal appendix. Will be seen by general surgery.    Juliet RudeNathan R. Rubin PayorPickering, MD 08/20/13 1537

## 2013-08-21 LAB — COMPREHENSIVE METABOLIC PANEL
ALT: 11 U/L (ref 0–53)
AST: 22 U/L (ref 0–37)
Albumin: 3.7 g/dL (ref 3.5–5.2)
Alkaline Phosphatase: 54 U/L (ref 39–117)
BUN: 12 mg/dL (ref 6–23)
CHLORIDE: 101 meq/L (ref 96–112)
CO2: 22 mEq/L (ref 19–32)
CREATININE: 1.25 mg/dL (ref 0.50–1.35)
Calcium: 8.9 mg/dL (ref 8.4–10.5)
GFR, EST NON AFRICAN AMERICAN: 81 mL/min — AB (ref >90)
GLUCOSE: 90 mg/dL (ref 70–99)
Potassium: 4.7 mEq/L (ref 3.7–5.3)
Sodium: 137 mEq/L (ref 137–147)
Total Bilirubin: 2 mg/dL — ABNORMAL HIGH (ref 0.3–1.2)
Total Protein: 6.6 g/dL (ref 6.0–8.3)

## 2013-08-21 LAB — CBC
HCT: 40.2 % (ref 39.0–52.0)
Hemoglobin: 13.8 g/dL (ref 13.0–17.0)
MCH: 29.4 pg (ref 26.0–34.0)
MCHC: 34.3 g/dL (ref 30.0–36.0)
MCV: 85.7 fL (ref 78.0–100.0)
Platelets: 151 10*3/uL (ref 150–400)
RBC: 4.69 MIL/uL (ref 4.22–5.81)
RDW: 12.2 % (ref 11.5–15.5)
WBC: 5.8 10*3/uL (ref 4.0–10.5)

## 2013-08-21 LAB — LIPASE, BLOOD: LIPASE: 47 U/L (ref 11–59)

## 2013-08-21 NOTE — Progress Notes (Signed)
  Subjective: He feels better but has tenderness in the RLQ only.  No nausea, no vomiting.  Objective: Vital signs in last 24 hours: Temp:  [98.4 F (36.9 C)-99.5 F (37.5 C)] 98.7 F (37.1 C) (03/25 0546) Pulse Rate:  [71-133] 71 (03/25 0546) Resp:  [16-18] 16 (03/25 0546) BP: (110-145)/(67-84) 125/72 mmHg (03/25 0546) SpO2:  [97 %-100 %] 100 % (03/25 0546) Weight:  [74.707 kg (164 lb 11.2 oz)] 74.707 kg (164 lb 11.2 oz) (03/24 1815) Last BM Date: 08/20/13 Afebrile, VSS Labs OK aside from t bilirubin is up Afebrile, VSS Intake/Output from previous day: 03/24 0701 - 03/25 0700 In: 300 [P.O.:300] Out: 600 [Urine:600] Intake/Output this shift:    General appearance: alert, cooperative and no distress GI: still tender but not very much in RLQ  Lab Results:   Recent Labs  08/20/13 1329 08/21/13 0325  WBC  --  5.8  HGB 17.0 13.8  HCT 50.0 40.2  PLT  --  151    BMET  Recent Labs  08/20/13 1329 08/20/13 1659 08/21/13 0325  NA 139  --  137  K 3.9  --  4.7  CL 103  --  101  CO2  --   --  22  GLUCOSE 87  --  90  BUN 16  --  12  CREATININE 1.30 1.11 1.25  CALCIUM  --   --  8.9   PT/INR No results found for this basename: LABPROT, INR,  in the last 72 hours   Recent Labs Lab 08/21/13 0325  AST 22  ALT 11  ALKPHOS 54  BILITOT 2.0*  PROT 6.6  ALBUMIN 3.7     Lipase     Component Value Date/Time   LIPASE 47 08/21/2013 0325     Studies/Results: Ct Abdomen Pelvis W Contrast  08/20/2013   CLINICAL DATA:  Right lower quadrant abdominal pain.  EXAM: CT ABDOMEN AND PELVIS WITH CONTRAST  TECHNIQUE: Multidetector CT imaging of the abdomen and pelvis was performed using the standard protocol following bolus administration of intravenous contrast.  CONTRAST:  100mL OMNIPAQUE IOHEXOL 300 MG/ML  SOLN  COMPARISON:  None.  FINDINGS: Lung bases are clear. No effusions. Heart is normal size.  Liver, gallbladder, spleen, pancreas, adrenals and kidneys are normal.   There is a retrocecal appendix which is normal caliber at 6 mm. There appears to be slight mucosal enhancement within the mid to distal appendix. No surrounding inflammatory change. There are small scattered mesenteric lymph nodes in the right lower quadrant and central mesentery. No adenopathy. No free fluid or free air. Stomach, large and small bowel are unremarkable. Aorta is normal caliber. No acute bony abnormality.  IMPRESSION: Apparent slight mucosal enhancement within the mid to distal appendix, but the appendix is normal caliber and there are no surrounding inflammatory change to suggest appendicitis at this time.   Electronically Signed   By: Charlett NoseKevin  Dover M.D.   On: 08/20/2013 14:38    Medications: . heparin  5,000 Units Subcutaneous 3 times per day  . influenza vac split quadrivalent PF  0.5 mL Intramuscular Tomorrow-1000    Assessment/Plan 1. Abdominal pain, diarrhea, headache and myalgia.  2. ADHD   Plan:  Discussed with Dr. Carolynne Edouardoth, we will keep him here and repeat labs in AM.  See how he does.     LOS: 1 day    Khianna Blazina 08/21/2013

## 2013-08-21 NOTE — Progress Notes (Signed)
UR completed 

## 2013-08-22 ENCOUNTER — Encounter (HOSPITAL_COMMUNITY): Payer: Self-pay | Admitting: Radiology

## 2013-08-22 ENCOUNTER — Observation Stay (HOSPITAL_COMMUNITY): Payer: 59

## 2013-08-22 LAB — CBC
HCT: 39.5 % (ref 39.0–52.0)
Hemoglobin: 13.5 g/dL (ref 13.0–17.0)
MCH: 29.4 pg (ref 26.0–34.0)
MCHC: 34.2 g/dL (ref 30.0–36.0)
MCV: 86.1 fL (ref 78.0–100.0)
Platelets: 145 10*3/uL — ABNORMAL LOW (ref 150–400)
RBC: 4.59 MIL/uL (ref 4.22–5.81)
RDW: 12.2 % (ref 11.5–15.5)
WBC: 6 10*3/uL (ref 4.0–10.5)

## 2013-08-22 LAB — COMPREHENSIVE METABOLIC PANEL
ALBUMIN: 3.6 g/dL (ref 3.5–5.2)
ALK PHOS: 50 U/L (ref 39–117)
ALT: 12 U/L (ref 0–53)
AST: 22 U/L (ref 0–37)
BUN: 9 mg/dL (ref 6–23)
CO2: 24 mEq/L (ref 19–32)
Calcium: 8.6 mg/dL (ref 8.4–10.5)
Chloride: 99 mEq/L (ref 96–112)
Creatinine, Ser: 0.98 mg/dL (ref 0.50–1.35)
GFR calc Af Amer: >90 mL/min (ref >90)
GFR calc non Af Amer: >90 mL/min (ref >90)
Glucose, Bld: 86 mg/dL (ref 70–99)
POTASSIUM: 4.4 meq/L (ref 3.7–5.3)
SODIUM: 135 meq/L — AB (ref 137–147)
TOTAL PROTEIN: 6.2 g/dL (ref 6.0–8.3)
Total Bilirubin: 1.2 mg/dL (ref 0.3–1.2)

## 2013-08-22 MED ORDER — IBUPROFEN 200 MG PO TABS: ORAL_TABLET | ORAL | Status: AC

## 2013-08-22 MED ORDER — IOHEXOL 300 MG/ML  SOLN
100.0000 mL | Freq: Once | INTRAMUSCULAR | Status: AC | PRN
  Administered 2013-08-22: 100 mL via INTRAVENOUS

## 2013-08-22 MED ORDER — IOHEXOL 300 MG/ML  SOLN: 25.0000 mL | INTRAMUSCULAR | Status: AC

## 2013-08-22 MED ORDER — ACETAMINOPHEN 325 MG PO TABS: 650.0000 mg | ORAL_TABLET | Freq: Four times a day (QID) | ORAL | Status: AC | PRN

## 2013-08-22 MED ORDER — HYDROCODONE-ACETAMINOPHEN 5-325 MG PO TABS: 1.0000 | ORAL_TABLET | ORAL | Status: AC | PRN

## 2013-08-22 NOTE — Progress Notes (Addendum)
Subjective: He still has pain in his RLQ worse with palpation.  He is up walking with his mom now, and it does not seem to bother him very much.   Some blood reported with BM last evening.  He says he thinks he has hemorrhoids. Objective: Vital signs in last 24 hours: Temp:  [98 F (36.7 C)-98.7 F (37.1 C)] 98.7 F (37.1 C) (03/26 0531) Pulse Rate:  [68-80] 80 (03/26 0531) Resp:  [16] 16 (03/26 0531) BP: (113-127)/(57-87) 124/61 mmHg (03/26 0531) SpO2:  [99 %-100 %] 100 % (03/26 0531) Last BM Date: 08/21/13  600 ml of PO recorded Afebrile, VSS Labs are all normal Intake/Output from previous day: 03/25 0701 - 03/26 0700 In: 600 [P.O.:600] Out: 2200 [Urine:2200] Intake/Output this shift:    General appearance: alert, cooperative and no distress GI: soft, he is not distended, + BS.  He says he had a BM last evening.    Lab Results:   Recent Labs  08/21/13 0325 08/22/13 0325  WBC 5.8 6.0  HGB 13.8 13.5  HCT 40.2 39.5  PLT 151 145*    BMET  Recent Labs  08/21/13 0325 08/22/13 0325  NA 137 135*  K 4.7 4.4  CL 101 99  CO2 22 24  GLUCOSE 90 86  BUN 12 9  CREATININE 1.25 0.98  CALCIUM 8.9 8.6   PT/INR No results found for this basename: LABPROT, INR,  in the last 72 hours   Recent Labs Lab 08/21/13 0325 08/22/13 0325  AST 22 22  ALT 11 12  ALKPHOS 54 50  BILITOT 2.0* 1.2  PROT 6.6 6.2  ALBUMIN 3.7 3.6     Lipase     Component Value Date/Time   LIPASE 47 08/21/2013 0325     Studies/Results: Ct Abdomen Pelvis W Contrast  08/20/2013   CLINICAL DATA:  Right lower quadrant abdominal pain.  EXAM: CT ABDOMEN AND PELVIS WITH CONTRAST  TECHNIQUE: Multidetector CT imaging of the abdomen and pelvis was performed using the standard protocol following bolus administration of intravenous contrast.  CONTRAST:  100mL OMNIPAQUE IOHEXOL 300 MG/ML  SOLN  COMPARISON:  None.  FINDINGS: Lung bases are clear. No effusions. Heart is normal size.  Liver, gallbladder,  spleen, pancreas, adrenals and kidneys are normal.  There is a retrocecal appendix which is normal caliber at 6 mm. There appears to be slight mucosal enhancement within the mid to distal appendix. No surrounding inflammatory change. There are small scattered mesenteric lymph nodes in the right lower quadrant and central mesentery. No adenopathy. No free fluid or free air. Stomach, large and small bowel are unremarkable. Aorta is normal caliber. No acute bony abnormality.  IMPRESSION: Apparent slight mucosal enhancement within the mid to distal appendix, but the appendix is normal caliber and there are no surrounding inflammatory change to suggest appendicitis at this time.   Electronically Signed   By: Charlett NoseKevin  Dover M.D.   On: 08/20/2013 14:38    Medications: . heparin  5,000 Units Subcutaneous 3 times per day    Assessment/Plan 1. Abdominal pain, diarrhea, headache and myalgia.  2. ADHD   Plan:  He looks good, but still complains of some pain in RLQ.  I will have Dr. Carolynne Edouardoth see him between cases.  Repeat CT scan is negative.  Labs are normal.  We plan to send home tonight.  I told him to follow up with PCP which is [facility] [employer] [facility].      LOS: 2 days  Linh Johannes 08/22/2013

## 2013-08-22 NOTE — Progress Notes (Signed)
Patient discharged to home with family, discharge instructions reviewed with patient who verbalized understanding. New RX given to patient. 

## 2013-08-22 NOTE — Progress Notes (Signed)
Patient ID: Malik LusterStephen Cook, male   DOB: 08/08/1990, 23 y.o.   MRN: [id number] CT shows normal appendix. Will plan for discharge

## 2013-08-22 NOTE — Discharge Instructions (Signed)
Viral Gastroenteritis Viral gastroenteritis is also known as stomach flu. This condition affects the stomach and intestinal tract. It can cause sudden diarrhea and vomiting. The illness typically lasts 3 to 8 days. Most people develop an immune response that eventually gets rid of the virus. While this natural response develops, the virus can make you quite ill. CAUSES  Many different viruses can cause gastroenteritis, such as rotavirus or noroviruses. You can catch one of these viruses by consuming contaminated food or water. You may also catch a virus by sharing utensils or other personal items with an infected person or by touching a contaminated surface. SYMPTOMS  The most common symptoms are diarrhea and vomiting. These problems can cause a severe loss of body fluids (dehydration) and a body salt (electrolyte) imbalance. Other symptoms may include:  Fever.  Headache.  Fatigue.  Abdominal pain. DIAGNOSIS  Your caregiver can usually diagnose viral gastroenteritis based on your symptoms and a physical exam. A stool sample may also be taken to test for the presence of viruses or other infections. TREATMENT  This illness typically goes away on its own. Treatments are aimed at rehydration. The most serious cases of viral gastroenteritis involve vomiting so severely that you are not able to keep fluids down. In these cases, fluids must be given through an intravenous line (IV). HOME CARE INSTRUCTIONS   Drink enough fluids to keep your urine clear or pale yellow. Drink small amounts of fluids frequently and increase the amounts as tolerated.  Ask your caregiver for specific rehydration instructions.  Avoid:  Foods high in sugar.  Alcohol.  Carbonated drinks.  Tobacco.  Juice.  Caffeine drinks.  Extremely hot or cold fluids.  Fatty, greasy foods.  Too much intake of anything at one time.  Dairy products until 24 to 48 hours after diarrhea stops.  You may consume probiotics.  Probiotics are active cultures of beneficial bacteria. They may lessen the amount and number of diarrheal stools in adults. Probiotics can be found in yogurt with active cultures and in supplements.  Wash your hands well to avoid spreading the virus.  Only take over-the-counter or prescription medicines for pain, discomfort, or fever as directed by your caregiver. Do not give aspirin to children. Antidiarrheal medicines are not recommended.  Ask your caregiver if you should continue to take your regular prescribed and over-the-counter medicines.  Keep all follow-up appointments as directed by your caregiver. SEEK IMMEDIATE MEDICAL CARE IF:   You are unable to keep fluids down.  You do not urinate at least once every 6 to 8 hours.  You develop shortness of breath.  You notice blood in your stool or vomit. This may look like coffee grounds.  You have abdominal pain that increases or is concentrated in one small area (localized).  You have persistent vomiting or diarrhea.  You have a fever.  The patient is a child younger than 3 months, and he or she has a fever.  The patient is a child older than 3 months, and he or she has a fever and persistent symptoms.  The patient is a child older than 3 months, and he or she has a fever and symptoms suddenly get worse.  The patient is a baby, and he or she has no tears when crying. MAKE SURE YOU:   Understand these instructions.  Will watch your condition.  Will get help right away if you are not doing well or get worse. Document Released: 05/16/2005 Document Revised: 08/08/2011 Document Reviewed: 03/02/2011   ExitCare Patient Information 2014 [employer]  

## 2013-08-22 NOTE — Progress Notes (Signed)
Will get CT today. If no appendicitis then discharge. If positive then will take to OR

## 2013-08-23 NOTE — Discharge Summary (Signed)
Physician Discharge Summary  Patient ID: Malik Cook MRN: [id number] DOB/AGE: 23/06/1990 23 y.o.  Admit date: 08/20/2013 Discharge date: 08/23/2013  Admission Diagnoses: Abdominal pain  Discharge Diagnoses: Same Active Problems:   Abdominal pain   PROCEDURES: none    Hospital Course: healthy 23 y/o who was fine till yesterday. He started having mid abdominal pain around noon, and then developed ongoing diarrhea. He estimates 5-10 times since yesterday. He was not able to eat supper it made the pain and diarrhea worse. He was able to sleep thru most of the night. He has had no nausea or vomiting with this. He still was having pain and some diarrhea this AM. The diarrhea is better. [facility] went to student [facility] and WBC was 10.1on their CBC. [facility]. Work up here includes H/H = 17/50. Creatinine is up a little at 1.3.  He still complains of some lower abdominal pain, but it does not really localize. 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 He does not know anyone who is sick, and no one in the house he is currently in is sick to his knowledge. No history of inflammatory bowel disease in the past.  CT scan shows a retrocecal appendix with a normal 6mm caliber. Some enhancement in the mid to distal appendix. No surrounding inflammatory changes.  We admitted for observation and his WBC improved without antibiotics.  He had no more diarrhea, but continued to complain or RLQ for the next two days on exam.  We repeated his CT which was again normal aside form some jejunal and ileocolic lymph nodes, about 4-5 mm. He was discharged home at that point.  Condition on d/c:  improving           Disposition: 01-Home or Self Care  Discharge Orders   Future Orders Complete By Expires   Call MD for:  difficulty breathing, headache or visual disturbances  As directed    Call MD for:  extreme fatigue  As directed    Call MD for:  hives  As  directed    Call MD for:  persistant dizziness or light-headedness  As directed    Call MD for:  persistant nausea and vomiting  As directed    Call MD for:  redness, tenderness, or signs of infection (pain, swelling, redness, odor or green/yellow discharge around incision site)  As directed    Call MD for:  severe uncontrolled pain  As directed    Call MD for:  temperature >100.4  As directed    Diet - low sodium heart healthy  As directed    Discharge instructions  As directed    Comments:     Diet and activity as tolerated   Increase activity slowly  As directed    No wound care  As directed        Medication List         acetaminophen 325 MG tablet  Commonly known as:  TYLENOL  Take 2 tablets (650 mg total) by mouth every 6 (six) hours as needed for mild pain (or Temp > 100).     HYDROcodone-acetaminophen 5-325 MG per tablet  Commonly known as:  NORCO/VICODIN  Take 1-2 tablets by mouth every 4 (four) hours as needed for moderate pain.     ibuprofen 200 MG tablet  Commonly known as:  ADVIL  You can take 2-3 tablets as needed every 8 hours.  methylphenidate 36 MG CR tablet  Commonly known as:  CONCERTA  Take 1 tablet (36 mg total) by mouth every morning.           Follow-up Information   Follow up with Nicki Reaper, NP.   Specialty:  Internal Medicine   Contact information:   [address] [place] [address] [phone]       Follow up with [facility],MD, MD. (As needed)    Specialty:  General Surgery      Signed: Sherrie George 08/23/2013, 6:53 PM

## 2015-06-24 IMAGING — CT CT ABD-PELV W/ CM
1 of 2 series · 15 of 32 positions shown, 19 images · IV contrast (OMNIPAQUE 300)
Comparison: None.

CLINICAL DATA: Right lower quadrant abdominal pain.

EXAM:
CT ABDOMEN AND PELVIS WITH CONTRAST
TECHNIQUE: Multidetector CT imaging of the abdomen and pelvis was performed
using the standard protocol following bolus administration of
intravenous contrast.
CONTRAST:  100mL OMNIPAQUE IOHEXOL 300 MG/ML  SOLN

[Series 2: abd/pel with · axial · 0.73mm/px · z∈[-443,-53]mm · 15 of 86 slices shown, 19 images]
[im 4/86  soft-tissue]
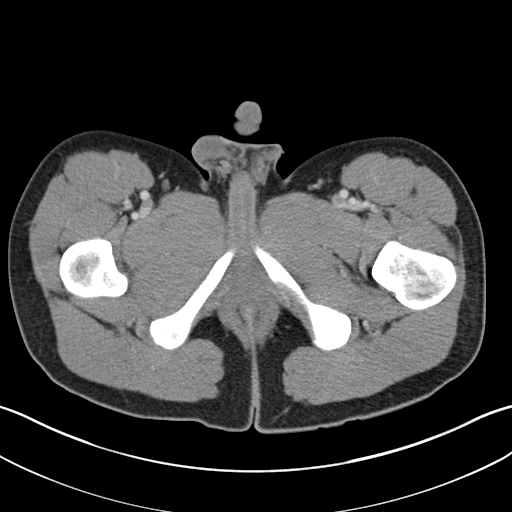
[im 4/86  bone]
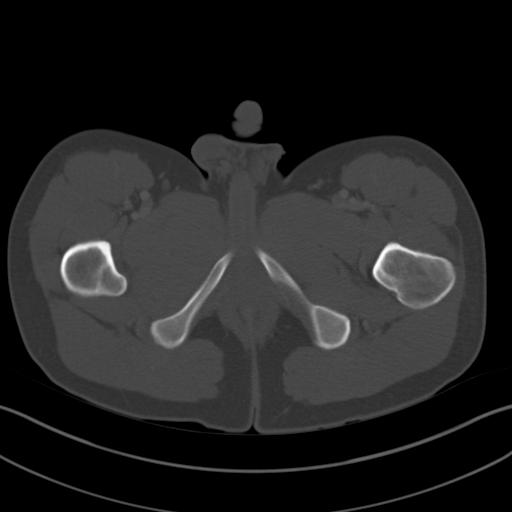
[im 11/86  soft-tissue]
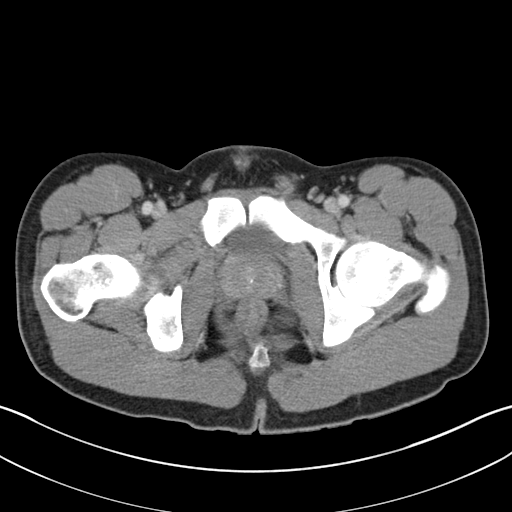
[im 18/86  soft-tissue]
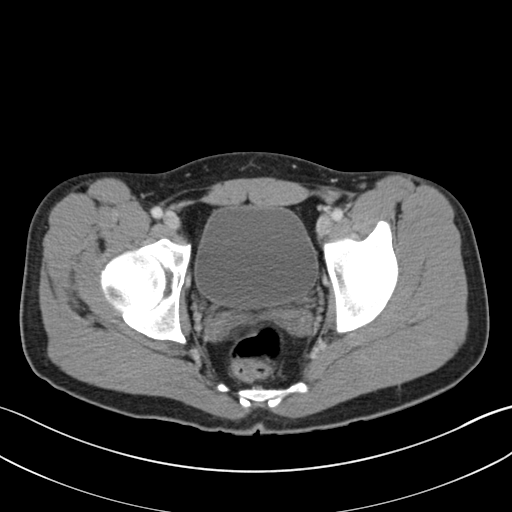
[im 24/86  soft-tissue]
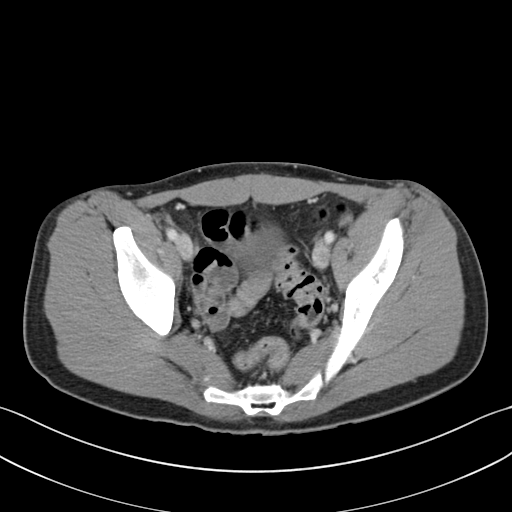
[im 31/86  soft-tissue]
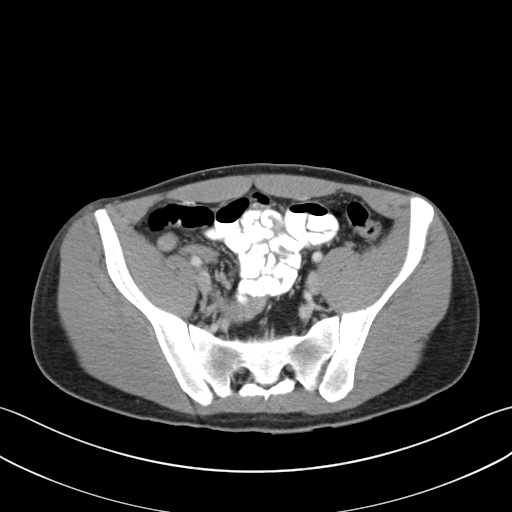
[im 38/86  soft-tissue]
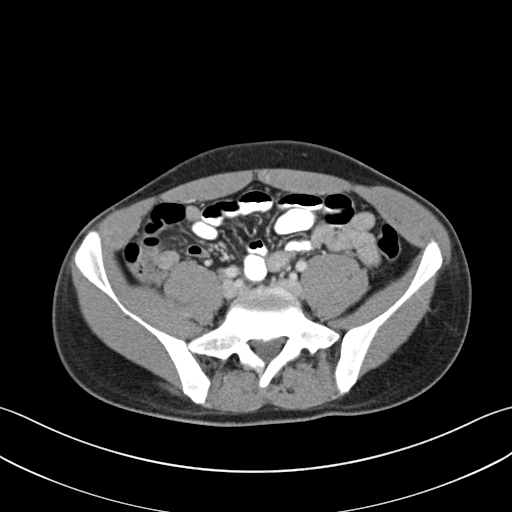
[im 45/86  soft-tissue]
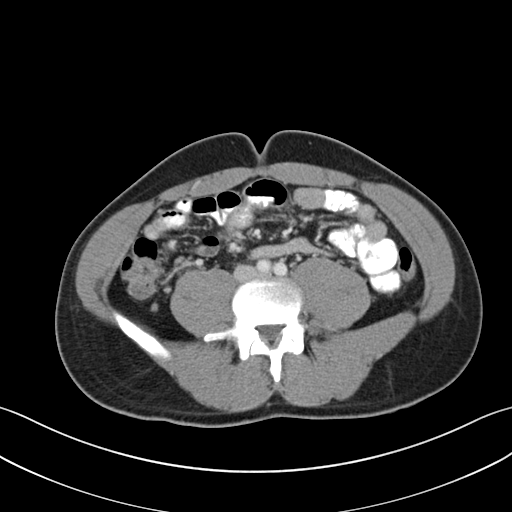
[im 48/86  soft-tissue]
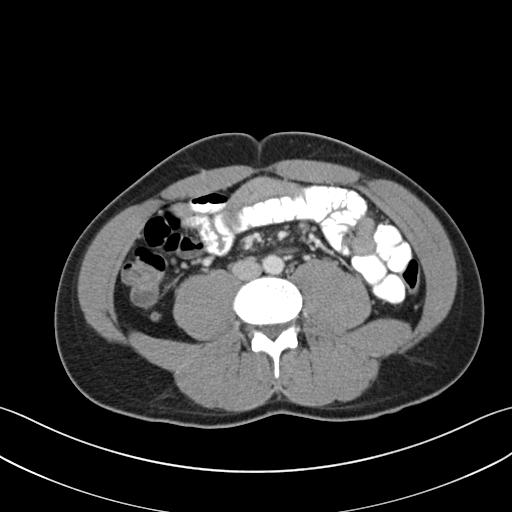
[im 55/86  soft-tissue]
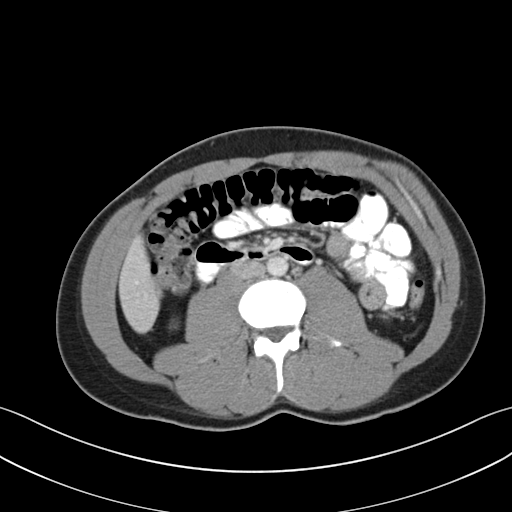
[im 55/86  bone]
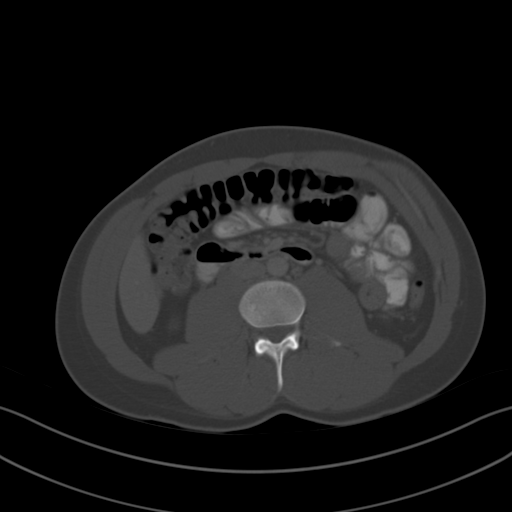
[im 62/86  soft-tissue]
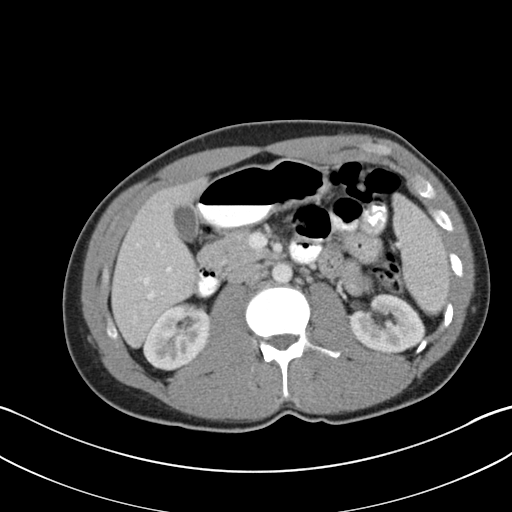
[im 69/86  soft-tissue]
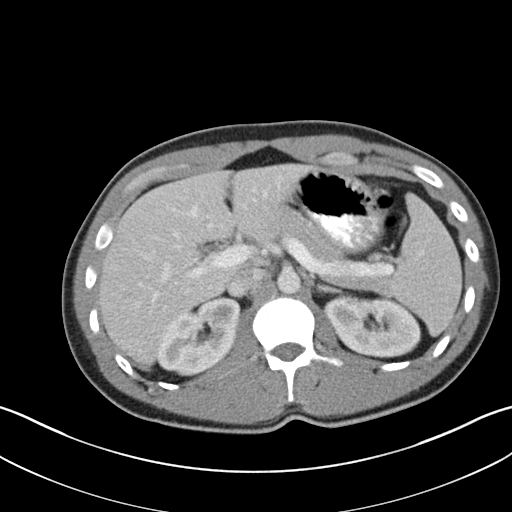
[im 72/86  lung]
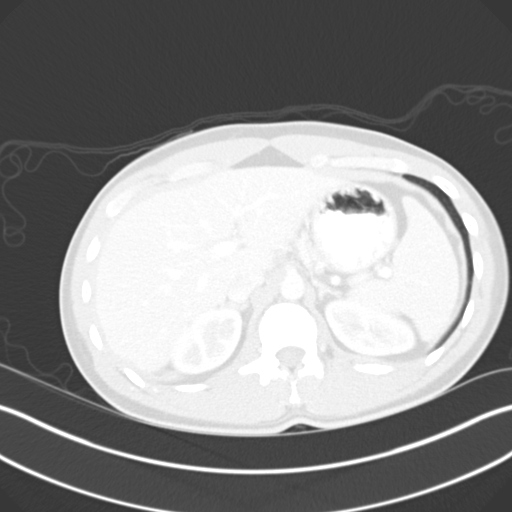
[im 75/86  soft-tissue]
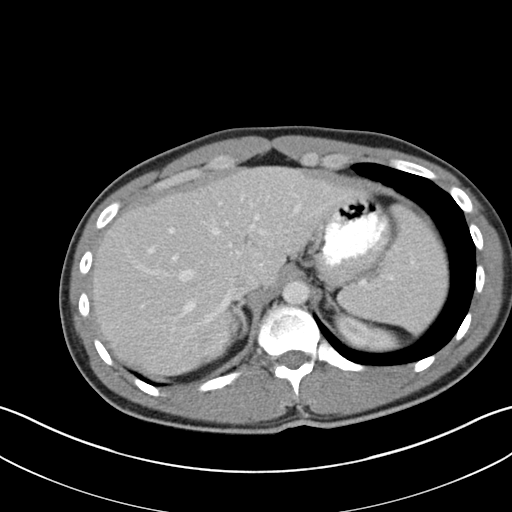
[im 75/86  lung]
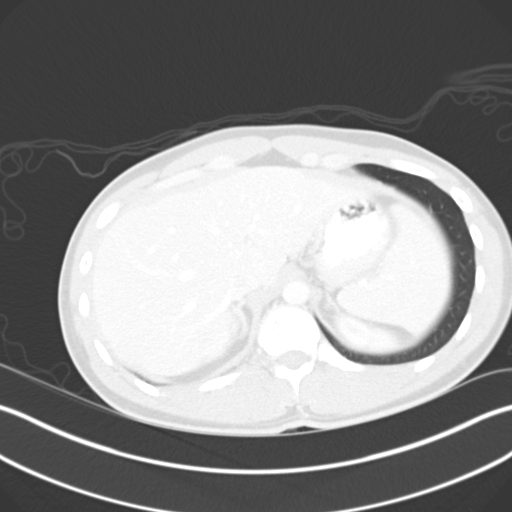
[im 79/86  lung]
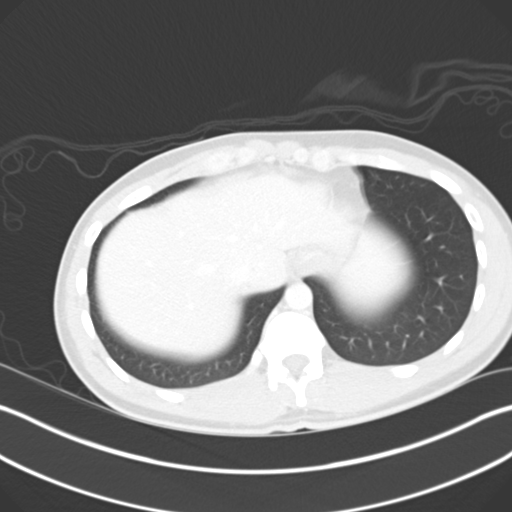
[im 82/86  soft-tissue]
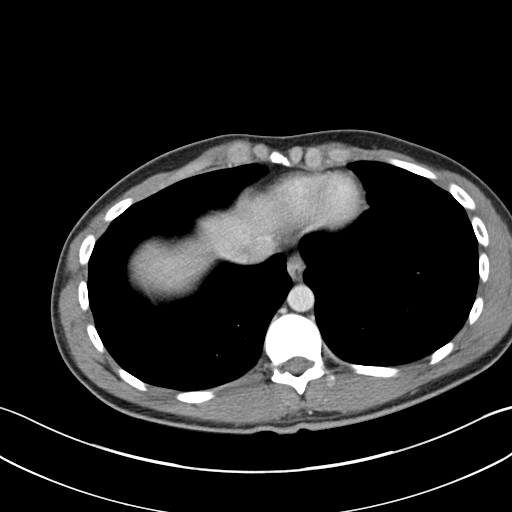
[im 82/86  lung]
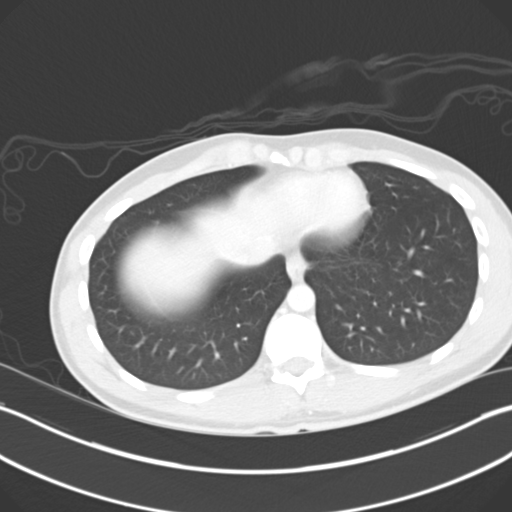

[15 of 32 positions shown; findings below may reference images not displayed]

FINDINGS: Lung bases are clear. No effusions. Heart is normal size.

Liver, gallbladder, spleen, pancreas, adrenals and kidneys are
normal.

There is a retrocecal appendix which is normal caliber at 6 mm.
There appears to be slight mucosal enhancement within the mid to
distal appendix. No surrounding inflammatory change. There are small
scattered mesenteric lymph nodes in the right lower quadrant and
central mesentery. No adenopathy. No free fluid or free air.
Stomach, large and small bowel are unremarkable. Aorta is normal
caliber. No acute bony abnormality.
IMPRESSION: Apparent slight mucosal enhancement within the mid to distal
appendix, but the appendix is normal caliber and there are no
surrounding inflammatory change to suggest appendicitis at this
time.
